# Patient Record
Sex: Female | Born: 1990 | Race: Black or African American | Hispanic: No | Marital: Single | Smoking: Never smoker
Health system: Southern US, Community
[De-identification: ages and names within clinical notes are randomized; demographics above are authoritative.]

## PROBLEM LIST (undated history)

## (undated) DIAGNOSIS — T7840XA Allergy, unspecified, initial encounter: Secondary | ICD-10-CM

## (undated) HISTORY — DX: Allergy, unspecified, initial encounter: T78.40XA

---

## 2012-03-26 ENCOUNTER — Encounter (HOSPITAL_COMMUNITY): Payer: Self-pay | Admitting: Family Medicine

## 2012-03-26 ENCOUNTER — Emergency Department (HOSPITAL_COMMUNITY)
Admission: EM | Admit: 2012-03-26 | Discharge: 2012-03-26 | Disposition: A | Discharge status: Home / Self Care | Payer: BC Managed Care – PPO | Attending: Emergency Medicine | Admitting: Emergency Medicine

## 2012-03-26 ENCOUNTER — Emergency Department (HOSPITAL_COMMUNITY): Payer: BC Managed Care – PPO

## 2012-03-26 DIAGNOSIS — S60211A Contusion of right wrist, initial encounter: Secondary | ICD-10-CM

## 2012-03-26 DIAGNOSIS — W2209XA Striking against other stationary object, initial encounter: Secondary | ICD-10-CM | POA: Insufficient documentation

## 2012-03-26 DIAGNOSIS — Y9389 Activity, other specified: Secondary | ICD-10-CM | POA: Insufficient documentation

## 2012-03-26 DIAGNOSIS — Y929 Unspecified place or not applicable: Secondary | ICD-10-CM | POA: Insufficient documentation

## 2012-03-26 DIAGNOSIS — S60219A Contusion of unspecified wrist, initial encounter: Secondary | ICD-10-CM | POA: Insufficient documentation

## 2012-03-26 MED ORDER — IBUPROFEN 600 MG PO TABS: 600.0000 mg | ORAL_TABLET | Freq: Four times a day (QID) | ORAL | Status: DC | PRN

## 2012-03-26 NOTE — ED Notes (Signed)
Per pt injured wrist about 1 week ago. sts she heard a pop. Wrist is swollen

## 2012-03-26 NOTE — ED Provider Notes (Signed)
History   This chart was scribed for Renee Skene, MD by Melba Coon, ED Scribe. The patient was seen in room TR07C/TR07C and the patient's care was started at 5:58PM.    CSN: [id number]  Arrival date & time 03/26/12  1654   None     Chief Complaint  Patient presents with  . Wrist Pain    (Consider location/radiation/quality/duration/timing/severity/associated sxs/prior treatment) The history is provided by the patient. No language interpreter was used.   Renee Thornton is a 21 y.o. female who presents to the Emergency Department complaining of constant, moderate to severe right wrist pain and swelling with an onset 3 days ago. She reports a car door hit her arm; after that she sat the wrong way on her wrist and she heard a "pop". She rates the severity of the pain 5/10. Ibuprofen and good strength slightly alleviated the pain but she reports that the pain came back. Denies HA, fever, neck pain, sore throat, rash, back pain, CP, SOB, abdominal pain, nausea, emesis, diarrhea, dysuria, or extremity weakness, numbness, or tingling. No known allergies. No other pertinent medical symptoms.   History reviewed. No pertinent past medical history.  History reviewed. No pertinent past surgical history.  History reviewed. No pertinent family history.  History  Substance Use Topics  . Smoking status: Never Smoker   . Smokeless tobacco: Not on file  . Alcohol Use: No    OB History    Grav Para Term Preterm Abortions TAB SAB Ect Mult Living                  Review of Systems 10 Systems reviewed and are negative for acute change except as noted in the HPI.  Allergies  Review of patient's allergies indicates no known allergies.  Home Medications   Current Outpatient Rx  Name  Route  Sig  Dispense  Refill  . OVER THE COUNTER MEDICATION   Oral   Take 1 tablet by mouth daily as needed. Pain medication           BP 124/75  Pulse 106  Temp 98.2 F (36.8 C)  Resp 18   SpO2 100%  LMP 02/25/2012  Physical Exam  Nursing notes reviewed.  Electronic medical record reviewed. VITAL SIGNS:   Filed Vitals:   03/26/12 1700  BP: 124/75  Pulse: 106  Temp: 98.2 F (36.8 C)  Resp: 18  SpO2: 100%   CONSTITUTIONAL: Awake, oriented, appears non-toxic HENT: Atraumatic, normocephalic, oral mucosa pink and moist, airway patent. Nares patent without drainage. External ears normal. EYES: Conjunctiva clear, EOMI, PERRLA NECK: Trachea midline, non-tender, supple CARDIOVASCULAR: Normal heart rate, Normal rhythm, No murmurs, rubs, gallops PULMONARY/CHEST: Clear to auscultation, no rhonchi, wheezes, or rales. Symmetrical breath sounds. Non-tender. ABDOMINAL: Non-distended, soft, non-tender - no rebound or guarding.  BS normal. NEUROLOGIC: Non-focal, moving all four extremities, no gross sensory or motor deficits. EXTREMITIES: No clubbing, cyanosis, or edema; mild tenderness to palpation on the radial aspect of wrist, no bruising or deformity, neurovascularly intact distally. SKIN: Warm, Dry, No erythema, No rash  ED Course  Procedures (including critical care time)  COORDINATION OF CARE:  6:02PM - imaging reviewed and is unremarkable. Ibuprofen and warm compresses at home is advised for [facility] and she is ready for d/c.  Labs Reviewed - No data to display Dg Wrist Complete Right  03/26/2012  *RADIOLOGY REPORT*  Clinical Data: Wrist pain  RIGHT WRIST - COMPLETE 3+ VIEW  Comparison: None.  Findings: Four views of  the right wrist submitted.  No acute fracture or subluxation.  No radiopaque foreign body.  IMPRESSION: No acute fracture or subluxation.   Original Report Authenticated By: Natasha Mead, M.D.    1. Contusion of right wrist       MDM  Renee Thornton is a 21 y.o. female patient presents with mild contusion to right wrist. No evidence for fracture or severe injury on physical exam on x-ray. Patient discharged home with ibuprofen conservative pain  measures.  I explained the diagnosis and have given explicit precautions to return to the ER including any other new or worsening symptoms. The patient understands and accepts the medical plan as it's been dictated and I have answered their questions. Discharge instructions concerning home care and prescriptions have been given.  The patient is STABLE and is discharged to home in good condition.   I personally performed the services described in this documentation, which was scribed in my presence. The recorded information has been reviewed and is accurate. Renee Thornton, M.D.   Renee Skene, MD 03/26/12 2159

## 2013-09-23 ENCOUNTER — Ambulatory Visit (INDEPENDENT_AMBULATORY_CARE_PROVIDER_SITE_OTHER): Payer: BC Managed Care – PPO | Admitting: Physician Assistant

## 2013-09-23 VITALS — BP 100/62 | HR 68 | Temp 98.8°F | Resp 20 | Ht 63.0 in | Wt 131.2 lb

## 2013-09-23 DIAGNOSIS — R05 Cough: Secondary | ICD-10-CM

## 2013-09-23 DIAGNOSIS — J029 Acute pharyngitis, unspecified: Secondary | ICD-10-CM

## 2013-09-23 DIAGNOSIS — J309 Allergic rhinitis, unspecified: Secondary | ICD-10-CM

## 2013-09-23 DIAGNOSIS — R059 Cough, unspecified: Secondary | ICD-10-CM

## 2013-09-23 MED ORDER — PREDNISONE 20 MG PO TABS: ORAL_TABLET | ORAL | Status: DC

## 2013-09-23 MED ORDER — ALBUTEROL SULFATE HFA 108 (90 BASE) MCG/ACT IN AERS: 2.0000 | INHALATION_SPRAY | RESPIRATORY_TRACT | Status: DC | PRN

## 2013-09-23 MED ORDER — FLUTICASONE PROPIONATE 50 MCG/ACT NA SUSP: 2.0000 | Freq: Every day | NASAL | Status: DC

## 2013-09-23 NOTE — Patient Instructions (Signed)
-  Get a good quality air filter -Trial of 1 tsp of local honey daily -Take meds as directed -Continue Zyrtec daily -Get a 2nd dehumidifier    Allergic Rhinitis Allergic rhinitis is when the mucous membranes in the nose respond to allergens. Allergens are particles in the air that cause your body to have an allergic reaction. This causes you to release allergic antibodies. Through a chain of events, these eventually cause you to release histamine into the blood stream. Although meant to protect the body, it is this release of histamine that causes your discomfort, such as frequent sneezing, congestion, and an itchy, runny nose.  CAUSES  Seasonal allergic rhinitis (hay fever) is caused by pollen allergens that may come from grasses, trees, and weeds. Year-round allergic rhinitis (perennial allergic rhinitis) is caused by allergens such as house dust mites, pet dander, and mold spores.  SYMPTOMS   Nasal stuffiness (congestion).  Itchy, runny nose with sneezing and tearing of the eyes. DIAGNOSIS  Your health care provider can help you determine the allergen or allergens that trigger your symptoms. If you and your health care provider are unable to determine the allergen, skin or blood testing may be used. TREATMENT  Allergic Rhinitis does not have a cure, but it can be controlled by:  Medicines and allergy shots (immunotherapy).  Avoiding the allergen. Hay fever may often be treated with antihistamines in pill or nasal spray forms. Antihistamines block the effects of histamine. There are over-the-counter medicines that may help with nasal congestion and swelling around the eyes. Check with your health care provider before taking or giving this medicine.  If avoiding the allergen or the medicine prescribed do not work, there are many new medicines your health care provider can prescribe. Stronger medicine may be used if initial measures are ineffective. Desensitizing injections can be used if  medicine and avoidance does not work. Desensitization is when a patient is given ongoing shots until the body becomes less sensitive to the allergen. Make sure you follow up with your health care provider if problems continue. HOME CARE INSTRUCTIONS It is not possible to completely avoid allergens, but you can reduce your symptoms by taking steps to limit your exposure to them. It helps to know exactly what you are allergic to so that you can avoid your specific triggers. SEEK MEDICAL CARE IF:   You have a fever.  You develop a cough that does not stop easily (persistent).  You have shortness of breath.  You start wheezing.  Symptoms interfere with normal daily activities. Document Released: 12/29/2000 Document Revised: 01/24/2013 Document Reviewed: 12/11/2012 [facility] Patient Information 2014 [place], [place].

## 2013-09-23 NOTE — Progress Notes (Signed)
Subjective:    Patient ID: Renee Thornton, female    DOB: 01-23-1991, 23 y.o.   MRN: [id number]  HPI Primary Physician: Default, Provider, MD  Chief Complaint: 2 day history of URI  HPI: 23 y.o. female with history below presents with 2 day history of cough and sore throat. Long standing on and off nasal congestion, rhinorrhea, and sneezing. Afebrile. No chills. Cough is mildly productive of light brown sputum and worse in the morning. No SOB or wheezing. Symptoms are worse when she is in her apartment although she does note them when she goes outside as well.     Has been trying Zyrtec in the past off and on. Has not seen too much of an improvement. Currently living in a stuffy apartment. Has to leave the window open. Has one dehumidifier that is located in her bedroom. No pets. Carpet floors. Cleans regularly. Renee Thornton is up in July. Trying to get out sooner.    Past Medical History  Diagnosis Date  . Allergy      Home Meds: Prior to Admission medications   Medication Sig Start Date End Date Taking? Authorizing Provider  cetirizine (ZYRTEC) 10 MG tablet Take 10 mg by mouth daily as needed for allergies.   Yes Historical Provider, MD    Allergies: No Known Allergies  History   Social History  . Marital Status: Single    Spouse Name: N/A    Number of Children: N/A  . Years of Education: N/A   Occupational History  . Not on file.   Social History Main Topics  . Smoking status: Never Smoker   . Smokeless tobacco: Never Used  . Alcohol Use: No  . Drug Use: No  . Sexual Activity: Not on file   Other Topics Concern  . Not on file   Social History Narrative  . No narrative on file     Review of Systems  Constitutional: Positive for fatigue. Negative for fever and chills.  HENT: Positive for congestion, rhinorrhea, sneezing and sore throat. Negative for ear pain, hearing loss and postnasal drip.        Nasal congestion.   Eyes: Negative for discharge and itching.    Associated when she is in her apartment.   Respiratory: Positive for cough. Negative for shortness of breath and wheezing.        Cough is mildly productive of light brown sputum. Cough is worse in the morning.   Gastrointestinal: Negative for nausea, vomiting and diarrhea.  Musculoskeletal: Negative for myalgias.  Allergic/Immunologic: Positive for environmental allergies.       Seasonal allergies.   Neurological: Negative for headaches.       Objective:   Physical Exam  Physical Exam: Blood pressure 100/62, pulse 68, temperature 98.8 F (37.1 C), temperature source Oral, resp. rate 20, height 5\' 3"  (1.6 m), weight 131 lb 4 oz (59.535 kg), last menstrual period 09/14/2013, SpO2 100.00%., Body mass index is 23.26 kg/(m^2). General: Well developed, well nourished, in no acute distress. Head: Normocephalic, atraumatic, eyes without discharge, sclera non-icteric, nares are pale and boggy. Bilateral auditory canals clear, TM's are without perforation, pearly grey and translucent with reflective cone of light bilaterally. Oral cavity moist, posterior pharynx without exudate, erythema, peritonsillar abscess, or post nasal drip. Uvula midline.   Neck: Supple. No thyromegaly. Full ROM. No lymphadenopathy. No nuchal rigidity.  Lungs: Clear bilaterally to auscultation without wheezes, rales, or rhonchi. Breathing is unlabored. Heart: RRR with S1 S2. No murmurs, rubs, or gallops  appreciated. Msk:  Strength and tone normal for age. Extremities/Skin: Warm and dry. No clubbing or cyanosis. No edema. No rashes or suspicious lesions. Neuro: Alert and oriented X 3. Moves all extremities spontaneously. Gait is normal. CNII-XII grossly in tact. Psych:  Responds to questions appropriately with a normal affect.        Assessment & Plan:  23 year old female with allergic rhinitis -Prednisone 20 mg #18 3x3, 2x3, 1x3 no RF -Flonase 2 sprays each nare daily #1 RF 6 -Proventil 2 puffs inhaled q 4-6 hours  prn #1 RF 2 -Continue Zyrtec -Use good quality air filters -1 tsp of local honey daily -RTC prn   Renee Thornton, MHS, PA-C [facility] [address] [place], [place] [address] [phone] [facility] 09/23/2013 3:10 PM

## 2014-01-06 ENCOUNTER — Ambulatory Visit (INDEPENDENT_AMBULATORY_CARE_PROVIDER_SITE_OTHER): Payer: BC Managed Care – PPO | Admitting: Family Medicine

## 2014-01-06 VITALS — BP 102/68 | HR 78 | Temp 98.1°F | Resp 18 | Ht 63.0 in | Wt 140.6 lb

## 2014-01-06 DIAGNOSIS — R350 Frequency of micturition: Secondary | ICD-10-CM

## 2014-01-06 DIAGNOSIS — N390 Urinary tract infection, site not specified: Secondary | ICD-10-CM

## 2014-01-06 LAB — POCT URINALYSIS DIPSTICK
BILIRUBIN UA: NEGATIVE
Glucose, UA: NEGATIVE
Ketones, UA: NEGATIVE
NITRITE UA: POSITIVE
PH UA: 6
Spec Grav, UA: 1.02
Urobilinogen, UA: 0.2

## 2014-01-06 LAB — POCT UA - MICROSCOPIC ONLY
CASTS, UR, LPF, POC: NEGATIVE
CRYSTALS, UR, HPF, POC: NEGATIVE
Mucus, UA: NEGATIVE
YEAST UA: NEGATIVE

## 2014-01-06 MED ORDER — CIPROFLOXACIN HCL 500 MG PO TABS: 500.0000 mg | ORAL_TABLET | Freq: Two times a day (BID) | ORAL | Status: DC

## 2014-01-06 NOTE — Patient Instructions (Signed)

## 2014-01-06 NOTE — Progress Notes (Signed)
Patient ID: Keeara Frees MRN: [id number], DOB: [birth year], 23 y.o. Date of Encounter: 01/06/2014, 11:40 AM  Primary Physician: Default, Provider, MD  Chief Complaint:  Chief Complaint  Patient presents with  . Abdominal Pain    x 3 days--left upper abdominal pain--painful to urinate    . Urinary Frequency    no itching--no burning--no odor    HPI: 23 y.o. year old female presents with 3 day history of dysuria, urgency, and frequency. Last UTI was years ago No hematuria LMP: 12/13/13 No sick contacts, recent antibiotics, or recent travels. She is not on any BC pills. She was taking [facility] cranberry pills to try and help with her symptoms but she said it provided no relief.   Studying speech/language path at [employer].  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  No vaginal discharge, back pain, fever, or foul smelling urine.  Past Medical History  Diagnosis Date  . Allergy      Home Meds: Prior to Admission medications   Not on File    Allergies: No Known Allergies  History   Social History  . Marital Status: Single    Spouse Name: N/A    Number of Children: N/A  . Years of Education: N/A   Occupational History  . Not on file.   Social History Main Topics  . Smoking status: Never Smoker   . Smokeless tobacco: Never Used  . Alcohol Use: No  . Drug Use: No  . Sexual Activity: Not on file   Other Topics Concern  . Not on file   Social History Narrative  . No narrative on file     Review of Systems: Constitutional: negative for chills, fever, night sweats or weight changes Cardiovascular: negative for chest pain or palpitations Respiratory: negative for hemoptysis, wheezing, or shortness of breath Abdominal: negative for nausea, vomiting or diarrhea. Positive for abdominal pain Dermatological: negative for rash Neurologic: negative for headache   Physical Exam: Blood pressure 102/68, pulse 78, temperature 98.1 F (36.7 C), temperature source Oral, resp. rate 18, height 5'  3" (1.6 m), weight 140 lb 9.6 oz (63.776 kg), SpO2 100.00%., Body mass index is 24.91 kg/(m^2). General: Well developed, well nourished, in no acute distress. Head: Normocephalic, atraumatic, eyes without discharge, sclera non-icteric, nares are congested. Bilateral auditory canals clear, TM's are without perforation, pearly grey with reflective cone of light bilaterally. Serous effusion bilaterally behind TM's. Maxillary sinus TTP. Oral cavity moist, dentition normal. Posterior pharynx with post nasal drip and mild erythema. No peritonsillar abscess or tonsillar exudate. Neck: Supple. No thyromegaly. Full ROM. No lymphadenopathy. Lungs: Coarse breath sounds bilaterally without Clear bilaterally to auscultation without wheezes, rales, or rhonchi. Breathing is unlabored.  Heart: RRR with S1 S2. No murmurs, rubs, or gallops appreciated. Abdomen: Soft, non-tender, non-distended with normoactive bowel sounds. No hepatosplenomegaly. No rebound/guarding. No obvious abdominal masses. McBurney's, Rovsing's, Iliopsoas, and table jar all negative. Msk:  Strength and tone normal for age. Extremities: No clubbing or cyanosis. No edema. Neuro: Alert and oriented X 3. Moves all extremities spontaneously. CNII-XII grossly in tact. Psych:  Responds to questions appropriately with a normal affect.   Labs: Results for orders placed in visit on 01/06/14  POCT URINALYSIS DIPSTICK      Result Value Ref Range   Color, UA yellow     Clarity, UA hazy     Glucose, UA neg     Bilirubin, UA neg     Ketones, UA neg     Spec Grav, UA 1.020  Blood, UA moderate     pH, UA 6.0     Protein, UA trace     Urobilinogen, UA 0.2     Nitrite, UA positive     Leukocytes, UA large (3+)    POCT UA - MICROSCOPIC ONLY      Result Value Ref Range   WBC, Ur, HPF, POC tntc     RBC, urine, microscopic 5-10     Bacteria, U Microscopic 1+     Mucus, UA neg     Epithelial cells, urine per micros 0-4     Crystals, Ur, HPF, POC  neg     Casts, Ur, LPF, POC neg     Yeast, UA neg       ASSESSMENT AND PLAN:  23 y.o. year old female with  1. Urine frequency    Urine frequency - Plan: POCT urinalysis dipstick, POCT UA - Microscopic Only, ciprofloxacin (CIPRO) 500 MG tablet, Urine culture  Urinary tract infection, site not specified - Plan: ciprofloxacin (CIPRO) 500 MG tablet, Urine culture    Signed, Elvina Sidle, MD 01/06/2014 11:40 AM

## 2014-01-08 LAB — URINE CULTURE: Colony Count: >=100000

## 2014-01-12 IMAGING — CR DG WRIST COMPLETE 3+V*R*
4 series · 4 of 4 positions shown · non-contrast
Comparison: None.

CLINICAL DATA: Wrist pain

RIGHT WRIST - COMPLETE 3+ VIEW

[x wrist pa right]
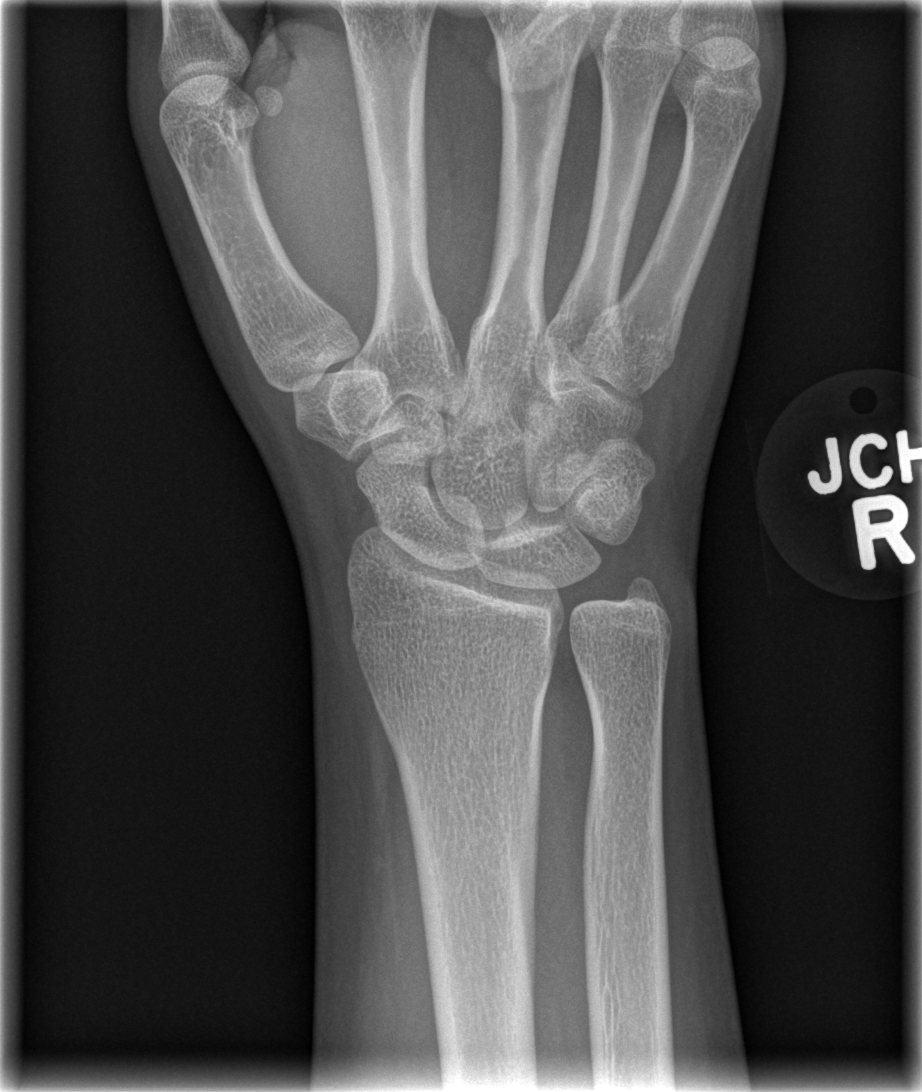

[x wrist obl right]
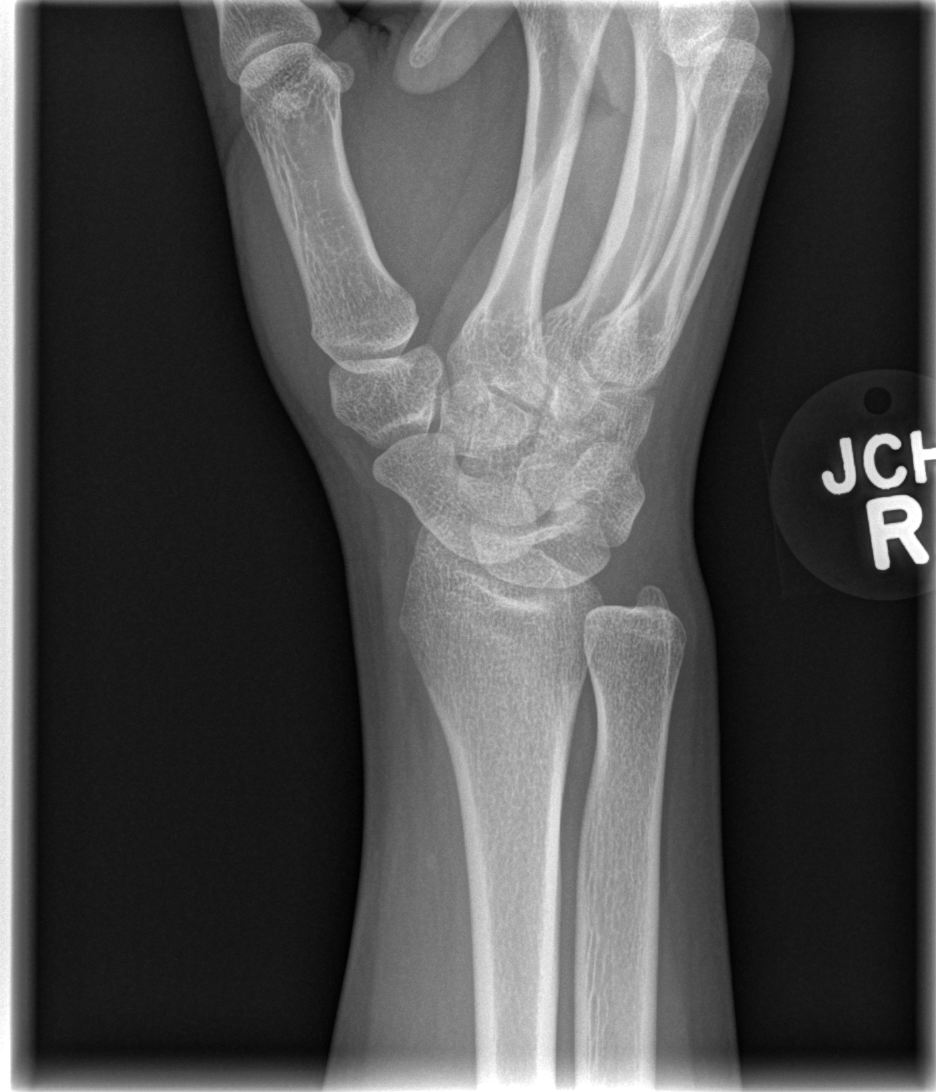

[x wrist lat right]
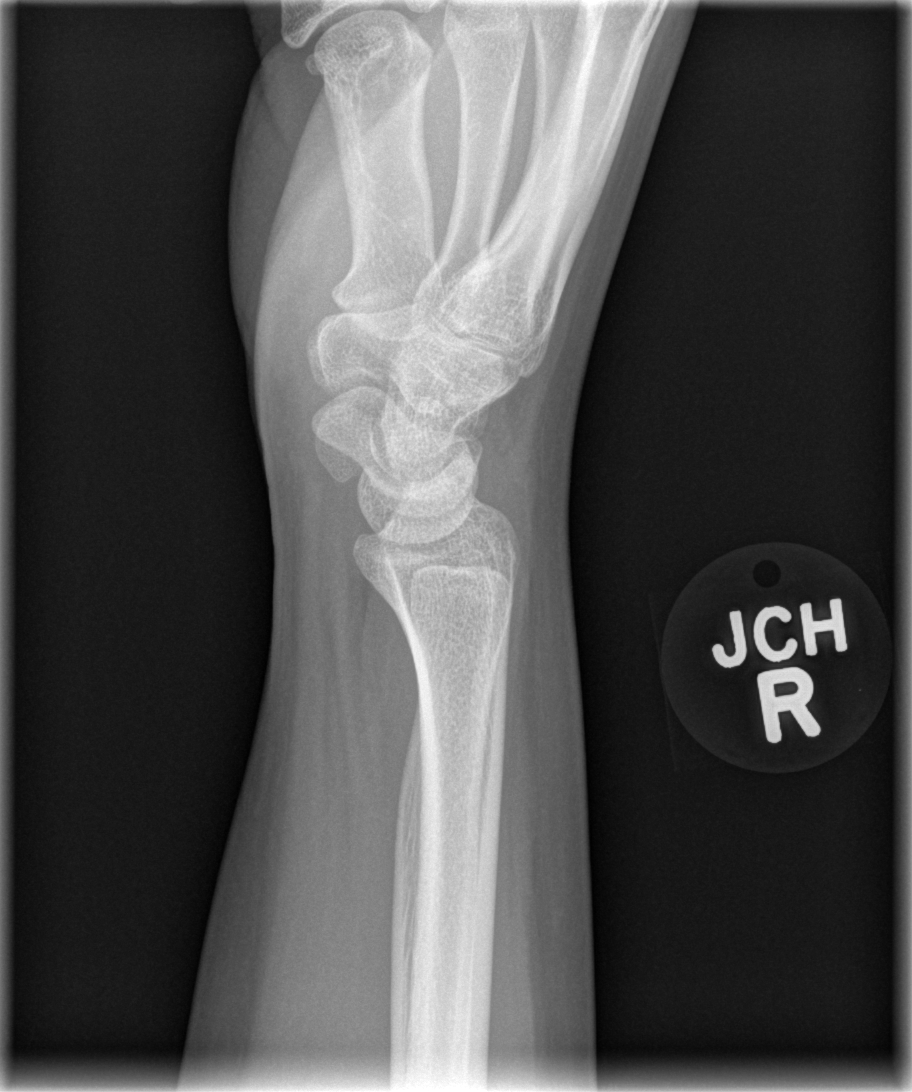

[x navicular]
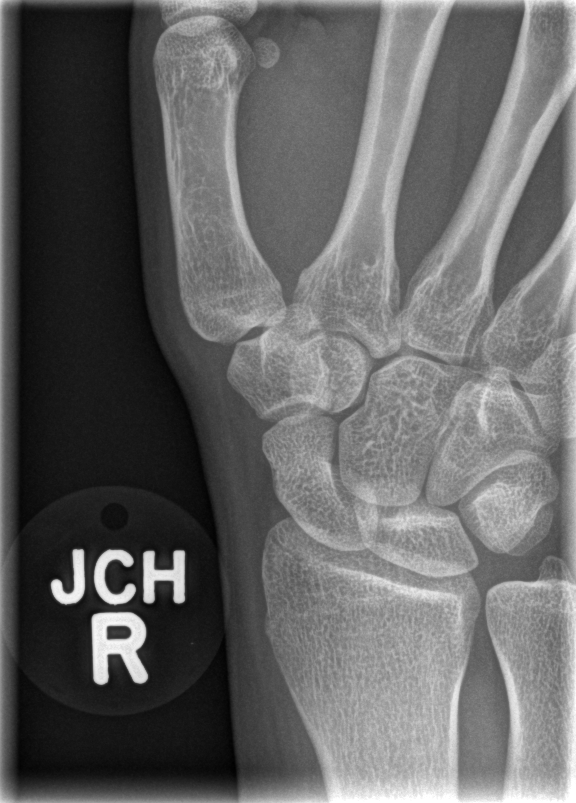

[4 of 4 positions shown; findings below may reference images not displayed]

FINDINGS: Four views of the right wrist submitted.  No acute
fracture or subluxation.  No radiopaque foreign body.
IMPRESSION: No acute fracture or subluxation.

## 2014-02-22 ENCOUNTER — Ambulatory Visit (INDEPENDENT_AMBULATORY_CARE_PROVIDER_SITE_OTHER): Payer: BC Managed Care – PPO | Admitting: Family Medicine

## 2014-02-22 VITALS — BP 126/74 | HR 78 | Temp 98.7°F | Resp 16 | Ht 63.0 in | Wt 141.0 lb

## 2014-02-22 DIAGNOSIS — A749 Chlamydial infection, unspecified: Secondary | ICD-10-CM

## 2014-02-22 DIAGNOSIS — N946 Dysmenorrhea, unspecified: Secondary | ICD-10-CM

## 2014-02-22 LAB — POCT URINE PREGNANCY: Preg Test, Ur: NEGATIVE

## 2014-02-22 NOTE — Progress Notes (Addendum)
Subjective:    Patient ID: Renee Rodeshelsea Thornton, female    DOB: 05/03/1990, 23 y.o.   MRN: [id number]   Chief Complaint  Patient presents with  . Abdominal Cramping    x 1 today needs note    There are no active problems to display for this patient.  Past Medical History  Diagnosis Date  . Allergy    No past surgical history on file. No Known Allergies Prior to Admission medications   Not on File   History   Social History  . Marital Status: Single    Spouse Name: N/A    Number of Children: N/A  . Years of Education: N/A   Occupational History  . Not on file.   Social History Main Topics  . Smoking status: Never Smoker   . Smokeless tobacco: Never Used  . Alcohol Use: No  . Drug Use: No  . Sexual Activity: Not on file   Other Topics Concern  . Not on file   Social History Narrative   HPI  Renee RodesChelsea Kalas is a 23 y.o. female  PCP: Default, Provider, MD   HPI Comments  Patient reports uterine cramping; she began her menstrual cycle today. She reports that her cramps are worse than normal. She was seen two weeks PTA and tested positive for chlamydia (treated with an antibiotic, with which she was compliant) and was supposed to return for a check up. She denies fevers or any unusual discharge. She denies any urinary symptoms (dysuria).   Her LNMP before this one was 1 month PTA; she believes this period may be earlier than usual.  She is currently sexually active. Last sexual encounter 1 month PTA; unprotected.  Number of lifetime sexual partners: 729, last partner was a new partner.   Review of Systems  Gastrointestinal: Positive for abdominal pain.  Genitourinary: Negative for vaginal discharge.  All other systems reviewed and are negative.      Objective:   Physical Exam  Constitutional: She is oriented to person, place, and time. She appears well-developed and well-nourished.  HENT:  Head: Normocephalic and atraumatic.  Neck: No tracheal deviation  present.  Cardiovascular: Normal rate.   Pulmonary/Chest: Effort normal.  Abdominal: There is no rebound and no guarding.  Minimal tenderness over suprapubic/lower abdomen; no lateral tenderness  Neurological: She is alert and oriented to person, place, and time.  Skin: Skin is warm and dry.  Psychiatric: She has a normal mood and affect. Her behavior is normal.  Nursing note and vitals reviewed.   Filed Vitals:   02/22/14 1459  BP: 126/74  Pulse: 78  Temp: 98.7 F (37.1 C)  Resp: 16  Height: 5\' 3"  (1.6 m)  Weight: 141 lb (63.957 kg)  SpO2: 99%   Results for orders placed or performed in visit on 02/22/14  POCT urine pregnancy  Result Value Ref Range   Preg Test, Ur Negative        Assessment & Plan:  Renee RodesChelsea Iten is a 23 y.o. female Menstrual cramps - Plan: POCT urine pregnancy, GC/Chlamydia Probe Amp  -sx care discussed, with NSAID, rtc precautions.   -out of work note given.  Chlamydia infection - Plan: GC/Chlamydia Probe Amp   -s/p treatment,  Will check test of cure.  Safer sex practices discussed and contraceptive options discussed - handout given.    No orders of the defined types were placed in this encounter.   Patient Instructions  Your pregnancy test was negative today. You should receive a  call or letter about your other lab results within the next week to 10 days. Over the counter Alleve can help with the cramping - can start this few days before next menses.   Condoms every time with sexual intercourse, and see the information on other contraceptive options. If you would like to start these, return to discuss further.   Return to the clinic or go to the nearest emergency room if any of your symptoms worsen or new symptoms occur.  Dysmenorrhea Menstrual cramps (dysmenorrhea) are caused by the muscles of the uterus tightening (contracting) during a menstrual period. For some women, this discomfort is merely bothersome. For others, dysmenorrhea can be  severe enough to interfere with everyday activities for a few days each month. Primary dysmenorrhea is menstrual cramps that last a couple of days when you start having menstrual periods or soon after. This often begins after a teenager starts having her period. As a woman gets older or has a baby, the cramps will usually lessen or disappear. Secondary dysmenorrhea begins later in life, lasts longer, and the pain may be stronger than primary dysmenorrhea. The pain may start before the period and last a few days after the period.  CAUSES  Dysmenorrhea is usually caused by an underlying problem, such as:  The tissue lining the uterus grows outside of the uterus in other areas of the body (endometriosis).  The endometrial tissue, which normally lines the uterus, is found in or grows into the muscular walls of the uterus (adenomyosis).  The pelvic blood vessels are engorged with blood just before the menstrual period (pelvic congestive syndrome).  Overgrowth of cells (polyps) in the lining of the uterus or cervix.  Falling down of the uterus (prolapse) because of loose or stretched ligaments.  Depression.  Bladder problems, infection, or inflammation.  Problems with the intestine, a tumor, or irritable bowel syndrome.  Cancer of the female organs or bladder.  A severely tipped uterus.  A very tight opening or closed cervix.  Noncancerous tumors of the uterus (fibroids).  Pelvic inflammatory disease (PID).  Pelvic scarring (adhesions) from a previous surgery.  Ovarian cyst.  An intrauterine device (IUD) used for birth control. RISK FACTORS You may be at greater risk of dysmenorrhea if:  You are younger than age 73.  You started puberty early.  You have irregular or heavy bleeding.  You have never given birth.  You have a family history of this problem.  You are a smoker. SIGNS AND SYMPTOMS   Cramping or throbbing pain in your lower abdomen.  Headaches.  Lower back  pain.  Nausea or vomiting.  Diarrhea.  Sweating or dizziness.  Loose stools. DIAGNOSIS  A diagnosis is based on your history, symptoms, physical exam, diagnostic tests, or procedures. Diagnostic tests or procedures may include:  Blood tests.  Ultrasonography.  An examination of the lining of the uterus (dilation and curettage, D&C).  An examination inside your abdomen or pelvis with a scope (laparoscopy).  X-rays.  CT scan.  MRI.  An examination inside the bladder with a scope (cystoscopy).  An examination inside the intestine or stomach with a scope (colonoscopy, gastroscopy). TREATMENT  Treatment depends on the cause of the dysmenorrhea. Treatment may include:  Pain medicine prescribed by your health care provider.  Birth control pills or an IUD with progesterone hormone in it.  Hormone replacement therapy.  Nonsteroidal anti-inflammatory drugs (NSAIDs). These may help stop the production of prostaglandins.  Surgery to remove adhesions, endometriosis, ovarian cyst, or  fibroids.  Removal of the uterus (hysterectomy).  Progesterone shots to stop the menstrual period.  Cutting the nerves on the sacrum that go to the female organs (presacral neurectomy).  Electric current to the sacral nerves (sacral nerve stimulation).  Antidepressant medicine.  Psychiatric therapy, counseling, or group therapy.  Exercise and physical therapy.  Meditation and yoga therapy.  Acupuncture. HOME CARE INSTRUCTIONS   Only take over-the-counter or prescription medicines as directed by your health care provider.  Place a heating pad or hot water bottle on your lower back or abdomen. Do not sleep with the heating pad.  Use aerobic exercises, walking, swimming, biking, and other exercises to help lessen the cramping.  Massage to the lower back or abdomen may help.  Stop smoking.  Avoid alcohol and caffeine. SEEK MEDICAL CARE IF:   Your pain does not get better with  medicine.  You have pain with sexual intercourse.  Your pain increases and is not controlled with medicines.  You have abnormal vaginal bleeding with your period.  You develop nausea or vomiting with your period that is not controlled with medicine. SEEK IMMEDIATE MEDICAL CARE IF:  You pass out.  Document Released: 04/05/2005 Document Revised: 12/06/2012 Document Reviewed: 09/21/2012 [facility] Patient Information 2015 [place], [place]. This information is not intended to replace advice given to you by your health care provider. Make sure you discuss any questions you have with your health care provider.  Contraception Choices Contraception (birth control) is the use of any methods or devices to prevent pregnancy. Below are some methods to help avoid pregnancy. HORMONAL METHODS   Contraceptive implant. This is a thin, plastic tube containing progesterone hormone. It does not contain estrogen hormone. Your health care provider inserts the tube in the inner part of the upper arm. The tube can remain in place for up to 3 years. After 3 years, the implant must be removed. The implant prevents the ovaries from releasing an egg (ovulation), thickens the cervical mucus to prevent sperm from entering the uterus, and thins the lining of the inside of the uterus.  Progesterone-only injections. These injections are given every 3 months by your health care provider to prevent pregnancy. This synthetic progesterone hormone stops the ovaries from releasing eggs. It also thickens cervical mucus and changes the uterine lining. This makes it harder for sperm to survive in the uterus.  Birth control pills. These pills contain estrogen and progesterone hormone. They work by preventing the ovaries from releasing eggs (ovulation). They also cause the cervical mucus to thicken, preventing the sperm from entering the uterus. Birth control pills are prescribed by a health care provider.Birth control pills can also be  used to treat heavy periods.  Minipill. This type of birth control pill contains only the progesterone hormone. They are taken every day of each month and must be prescribed by your health care provider.  Birth control patch. The patch contains hormones similar to those in birth control pills. It must be changed once a week and is prescribed by a health care provider.  Vaginal ring. The ring contains hormones similar to those in birth control pills. It is left in the vagina for 3 weeks, removed for 1 week, and then a new one is put back in place. The patient must be comfortable inserting and removing the ring from the vagina.A health care provider's prescription is necessary.  Emergency contraception. Emergency contraceptives prevent pregnancy after unprotected sexual intercourse. This pill can be taken right after sex or up to  5 days after unprotected sex. It is most effective the sooner you take the pills after having sexual intercourse. Most emergency contraceptive pills are available without a prescription. Check with your pharmacist. Do not use emergency contraception as your only form of birth control. BARRIER METHODS   Female condom. This is a thin sheath (latex or rubber) that is worn over the penis during sexual intercourse. It can be used with spermicide to increase effectiveness.  Female condom. This is a soft, loose-fitting sheath that is put into the vagina before sexual intercourse.  Diaphragm. This is a soft, latex, dome-shaped barrier that must be fitted by a health care provider. It is inserted into the vagina, along with a spermicidal jelly. It is inserted before intercourse. The diaphragm should be left in the vagina for 6 to 8 hours after intercourse.  Cervical cap. This is a round, soft, latex or plastic cup that fits over the cervix and must be fitted by a health care provider. The cap can be left in place for up to 48 hours after intercourse.  Sponge. This is a soft, circular  piece of polyurethane foam. The sponge has spermicide in it. It is inserted into the vagina after wetting it and before sexual intercourse.  Spermicides. These are chemicals that kill or block sperm from entering the cervix and uterus. They come in the form of creams, jellies, suppositories, foam, or tablets. They do not require a prescription. They are inserted into the vagina with an applicator before having sexual intercourse. The process must be repeated every time you have sexual intercourse. INTRAUTERINE CONTRACEPTION  Intrauterine device (IUD). This is a T-shaped device that is put in a woman's uterus during a menstrual period to prevent pregnancy. There are 2 types:  Copper IUD. This type of IUD is wrapped in copper wire and is placed inside the uterus. Copper makes the uterus and fallopian tubes produce a fluid that kills sperm. It can stay in place for 10 years.  Hormone IUD. This type of IUD contains the hormone progestin (synthetic progesterone). The hormone thickens the cervical mucus and prevents sperm from entering the uterus, and it also thins the uterine lining to prevent implantation of a fertilized egg. The hormone can weaken or kill the sperm that get into the uterus. It can stay in place for 3-5 years, depending on which type of IUD is used. PERMANENT METHODS OF CONTRACEPTION  Female tubal ligation. This is when the woman's fallopian tubes are surgically sealed, tied, or blocked to prevent the egg from traveling to the uterus.  Hysteroscopic sterilization. This involves placing a small coil or insert into each fallopian tube. Your doctor uses a technique called hysteroscopy to do the procedure. The device causes scar tissue to form. This results in permanent blockage of the fallopian tubes, so the sperm cannot fertilize the egg. It takes about 3 months after the procedure for the tubes to become blocked. You must use another form of birth control for these 3 months.  Female  sterilization. This is when the female has the tubes that carry sperm tied off (vasectomy).This blocks sperm from entering the vagina during sexual intercourse. After the procedure, the man can still ejaculate fluid (semen). NATURAL PLANNING METHODS  Natural family planning. This is not having sexual intercourse or using a barrier method (condom, diaphragm, cervical cap) on days the woman could become pregnant.  Calendar method. This is keeping track of the length of each menstrual cycle and identifying when you are fertile.  Ovulation method. This is avoiding sexual intercourse during ovulation.  Symptothermal method. This is avoiding sexual intercourse during ovulation, using a thermometer and ovulation symptoms.  Post-ovulation method. This is timing sexual intercourse after you have ovulated. Regardless of which type or method of contraception you choose, it is important that you use condoms to protect against the transmission of sexually transmitted infections (STIs). Talk with your health care provider about which form of contraception is most appropriate for you. Document Released: 04/05/2005 Document Revised: 04/10/2013 Document Reviewed: 09/28/2012 [facility] Patient Information 2015 [place], [place]. This information is not intended to replace advice given to you by your health care provider. Make sure you discuss any questions you have with your health care provider.     I personally performed the services described in this documentation, which was scribed in my presence. The recorded information has been reviewed and considered, and addended by me as needed.

## 2014-02-22 NOTE — Patient Instructions (Addendum)
Your pregnancy test was negative today. You should receive a call or letter about your other lab results within the next week to 10 days. Over the counter Alleve can help with the cramping - can start this few days before next menses.   Condoms every time with sexual intercourse, and see the information on other contraceptive options. If you would like to start these, return to discuss further.   Return to the clinic or go to the nearest emergency room if any of your symptoms worsen or new symptoms occur.  Dysmenorrhea Menstrual cramps (dysmenorrhea) are caused by the muscles of the uterus tightening (contracting) during a menstrual period. For some women, this discomfort is merely bothersome. For others, dysmenorrhea can be severe enough to interfere with everyday activities for a few days each month. Primary dysmenorrhea is menstrual cramps that last a couple of days when you start having menstrual periods or soon after. This often begins after a teenager starts having her period. As a woman gets older or has a baby, the cramps will usually lessen or disappear. Secondary dysmenorrhea begins later in life, lasts longer, and the pain may be stronger than primary dysmenorrhea. The pain may start before the period and last a few days after the period.  CAUSES  Dysmenorrhea is usually caused by an underlying problem, such as:  The tissue lining the uterus grows outside of the uterus in other areas of the body (endometriosis).  The endometrial tissue, which normally lines the uterus, is found in or grows into the muscular walls of the uterus (adenomyosis).  The pelvic blood vessels are engorged with blood just before the menstrual period (pelvic congestive syndrome).  Overgrowth of cells (polyps) in the lining of the uterus or cervix.  Falling down of the uterus (prolapse) because of loose or stretched ligaments.  Depression.  Bladder problems, infection, or inflammation.  Problems with the  intestine, a tumor, or irritable bowel syndrome.  Cancer of the female organs or bladder.  A severely tipped uterus.  A very tight opening or closed cervix.  Noncancerous tumors of the uterus (fibroids).  Pelvic inflammatory disease (PID).  Pelvic scarring (adhesions) from a previous surgery.  Ovarian cyst.  An intrauterine device (IUD) used for birth control. RISK FACTORS You may be at greater risk of dysmenorrhea if:  You are younger than age 77.  You started puberty early.  You have irregular or heavy bleeding.  You have never given birth.  You have a family history of this problem.  You are a smoker. SIGNS AND SYMPTOMS   Cramping or throbbing pain in your lower abdomen.  Headaches.  Lower back pain.  Nausea or vomiting.  Diarrhea.  Sweating or dizziness.  Loose stools. DIAGNOSIS  A diagnosis is based on your history, symptoms, physical exam, diagnostic tests, or procedures. Diagnostic tests or procedures may include:  Blood tests.  Ultrasonography.  An examination of the lining of the uterus (dilation and curettage, D&C).  An examination inside your abdomen or pelvis with a scope (laparoscopy).  X-rays.  CT scan.  MRI.  An examination inside the bladder with a scope (cystoscopy).  An examination inside the intestine or stomach with a scope (colonoscopy, gastroscopy). TREATMENT  Treatment depends on the cause of the dysmenorrhea. Treatment may include:  Pain medicine prescribed by your health care provider.  Birth control pills or an IUD with progesterone hormone in it.  Hormone replacement therapy.  Nonsteroidal anti-inflammatory drugs (NSAIDs). These may help stop the production of prostaglandins.  Surgery to remove adhesions, endometriosis, ovarian cyst, or fibroids.  Removal of the uterus (hysterectomy).  Progesterone shots to stop the menstrual period.  Cutting the nerves on the sacrum that go to the female organs (presacral  neurectomy).  Electric current to the sacral nerves (sacral nerve stimulation).  Antidepressant medicine.  Psychiatric therapy, counseling, or group therapy.  Exercise and physical therapy.  Meditation and yoga therapy.  Acupuncture. HOME CARE INSTRUCTIONS   Only take over-the-counter or prescription medicines as directed by your health care provider.  Place a heating pad or hot water bottle on your lower back or abdomen. Do not sleep with the heating pad.  Use aerobic exercises, walking, swimming, biking, and other exercises to help lessen the cramping.  Massage to the lower back or abdomen may help.  Stop smoking.  Avoid alcohol and caffeine. SEEK MEDICAL CARE IF:   Your pain does not get better with medicine.  You have pain with sexual intercourse.  Your pain increases and is not controlled with medicines.  You have abnormal vaginal bleeding with your period.  You develop nausea or vomiting with your period that is not controlled with medicine. SEEK IMMEDIATE MEDICAL CARE IF:  You pass out.  Document Released: 04/05/2005 Document Revised: 12/06/2012 Document Reviewed: 09/21/2012 [facility] Patient Information 2015 [place], [place]. This information is not intended to replace advice given to you by your health care provider. Make sure you discuss any questions you have with your health care provider.  Contraception Choices Contraception (birth control) is the use of any methods or devices to prevent pregnancy. Below are some methods to help avoid pregnancy. HORMONAL METHODS   Contraceptive implant. This is a thin, plastic tube containing progesterone hormone. It does not contain estrogen hormone. Your health care provider inserts the tube in the inner part of the upper arm. The tube can remain in place for up to 3 years. After 3 years, the implant must be removed. The implant prevents the ovaries from releasing an egg (ovulation), thickens the cervical mucus to prevent  sperm from entering the uterus, and thins the lining of the inside of the uterus.  Progesterone-only injections. These injections are given every 3 months by your health care provider to prevent pregnancy. This synthetic progesterone hormone stops the ovaries from releasing eggs. It also thickens cervical mucus and changes the uterine lining. This makes it harder for sperm to survive in the uterus.  Birth control pills. These pills contain estrogen and progesterone hormone. They work by preventing the ovaries from releasing eggs (ovulation). They also cause the cervical mucus to thicken, preventing the sperm from entering the uterus. Birth control pills are prescribed by a health care provider.Birth control pills can also be used to treat heavy periods.  Minipill. This type of birth control pill contains only the progesterone hormone. They are taken every day of each month and must be prescribed by your health care provider.  Birth control patch. The patch contains hormones similar to those in birth control pills. It must be changed once a week and is prescribed by a health care provider.  Vaginal ring. The ring contains hormones similar to those in birth control pills. It is left in the vagina for 3 weeks, removed for 1 week, and then a new one is put back in place. The patient must be comfortable inserting and removing the ring from the vagina.A health care provider's prescription is necessary.  Emergency contraception. Emergency contraceptives prevent pregnancy after unprotected sexual intercourse. This pill can  be taken right after sex or up to 5 days after unprotected sex. It is most effective the sooner you take the pills after having sexual intercourse. Most emergency contraceptive pills are available without a prescription. Check with your pharmacist. Do not use emergency contraception as your only form of birth control. BARRIER METHODS   Female condom. This is a thin sheath (latex or rubber)  that is worn over the penis during sexual intercourse. It can be used with spermicide to increase effectiveness.  Female condom. This is a soft, loose-fitting sheath that is put into the vagina before sexual intercourse.  Diaphragm. This is a soft, latex, dome-shaped barrier that must be fitted by a health care provider. It is inserted into the vagina, along with a spermicidal jelly. It is inserted before intercourse. The diaphragm should be left in the vagina for 6 to 8 hours after intercourse.  Cervical cap. This is a round, soft, latex or plastic cup that fits over the cervix and must be fitted by a health care provider. The cap can be left in place for up to 48 hours after intercourse.  Sponge. This is a soft, circular piece of polyurethane foam. The sponge has spermicide in it. It is inserted into the vagina after wetting it and before sexual intercourse.  Spermicides. These are chemicals that kill or block sperm from entering the cervix and uterus. They come in the form of creams, jellies, suppositories, foam, or tablets. They do not require a prescription. They are inserted into the vagina with an applicator before having sexual intercourse. The process must be repeated every time you have sexual intercourse. INTRAUTERINE CONTRACEPTION  Intrauterine device (IUD). This is a T-shaped device that is put in a woman's uterus during a menstrual period to prevent pregnancy. There are 2 types:  Copper IUD. This type of IUD is wrapped in copper wire and is placed inside the uterus. Copper makes the uterus and fallopian tubes produce a fluid that kills sperm. It can stay in place for 10 years.  Hormone IUD. This type of IUD contains the hormone progestin (synthetic progesterone). The hormone thickens the cervical mucus and prevents sperm from entering the uterus, and it also thins the uterine lining to prevent implantation of a fertilized egg. The hormone can weaken or kill the sperm that get into the  uterus. It can stay in place for 3-5 years, depending on which type of IUD is used. PERMANENT METHODS OF CONTRACEPTION  Female tubal ligation. This is when the woman's fallopian tubes are surgically sealed, tied, or blocked to prevent the egg from traveling to the uterus.  Hysteroscopic sterilization. This involves placing a small coil or insert into each fallopian tube. Your doctor uses a technique called hysteroscopy to do the procedure. The device causes scar tissue to form. This results in permanent blockage of the fallopian tubes, so the sperm cannot fertilize the egg. It takes about 3 months after the procedure for the tubes to become blocked. You must use another form of birth control for these 3 months.  Female sterilization. This is when the female has the tubes that carry sperm tied off (vasectomy).This blocks sperm from entering the vagina during sexual intercourse. After the procedure, the man can still ejaculate fluid (semen). NATURAL PLANNING METHODS  Natural family planning. This is not having sexual intercourse or using a barrier method (condom, diaphragm, cervical cap) on days the woman could become pregnant.  Calendar method. This is keeping track of the length of each  menstrual cycle and identifying when you are fertile.  Ovulation method. This is avoiding sexual intercourse during ovulation.  Symptothermal method. This is avoiding sexual intercourse during ovulation, using a thermometer and ovulation symptoms.  Post-ovulation method. This is timing sexual intercourse after you have ovulated. Regardless of which type or method of contraception you choose, it is important that you use condoms to protect against the transmission of sexually transmitted infections (STIs). Talk with your health care provider about which form of contraception is most appropriate for you. Document Released: 04/05/2005 Document Revised: 04/10/2013 Document Reviewed: 09/28/2012 [facility] Patient  Information 2015 [place], [place]. This information is not intended to replace advice given to you by your health care provider. Make sure you discuss any questions you have with your health care provider.

## 2014-02-23 LAB — GC/CHLAMYDIA PROBE AMP
CT Probe RNA: NEGATIVE
GC PROBE AMP APTIMA: NEGATIVE

## 2014-03-30 ENCOUNTER — Ambulatory Visit (INDEPENDENT_AMBULATORY_CARE_PROVIDER_SITE_OTHER): Payer: BC Managed Care – PPO | Admitting: Family Medicine

## 2014-03-30 VITALS — BP 112/72 | HR 83 | Temp 98.7°F | Resp 16 | Ht 64.0 in | Wt 140.0 lb

## 2014-03-30 DIAGNOSIS — R110 Nausea: Secondary | ICD-10-CM

## 2014-03-30 DIAGNOSIS — J029 Acute pharyngitis, unspecified: Secondary | ICD-10-CM

## 2014-03-30 LAB — POCT RAPID STREP A (OFFICE): RAPID STREP A SCREEN: NEGATIVE

## 2014-03-30 MED ORDER — ONDANSETRON 4 MG PO TBDP: 4.0000 mg | ORAL_TABLET | Freq: Three times a day (TID) | ORAL | Status: DC | PRN

## 2014-03-30 MED ORDER — ONDANSETRON 4 MG PO TBDP
4.0000 mg | ORAL_TABLET | Freq: Once | ORAL | Status: AC
  Administered 2014-03-30: 4 mg via ORAL

## 2014-03-30 NOTE — Progress Notes (Signed)
[facility] [address], [place] [id number] [phone]  Date:  03/30/2014   Name:  Renee Thornton   DOB:  30-Sep-1990   MRN:  [id number]  PCP:  Default, Provider, MD    Chief Complaint: Sore Throat   History of Present Illness:  Renee Thornton is a 23 y.o. very pleasant female patient who presents with the following:  she is here today with a ST and lesions in her throat for one day- "my throat looks disgusting." .  She has not noted a fever.   The throat is just a bit sore but no so bad.  She has not noted flu like sx such as aches or fatigue.   She has felt nauseated but has not vomited.   She is generally in good health. She has never had this in the past Never a smoker Menses currently She has not tried any medications so far at home  There are no active problems to display for this patient.   Past Medical History  Diagnosis Date  . Allergy     No past surgical history on file.  History  Substance Use Topics  . Smoking status: Never Smoker   . Smokeless tobacco: Never Used  . Alcohol Use: No    No family history on file.  No Known Allergies  Medication list has been reviewed and updated.  No current outpatient prescriptions on file prior to visit.   No current facility-administered medications on file prior to visit.    Review of Systems:  As per HPI- otherwise negative.   Physical Examination: Filed Vitals:   03/30/14 1621  BP: 112/72  Pulse: 83  Temp: 98.7 F (37.1 C)  Resp: 16   Filed Vitals:   03/30/14 1621  Height: 5\' 4"  (1.626 m)  Weight: 140 lb (63.504 kg)   Body mass index is 24.02 kg/(m^2). Ideal Body Weight: Weight in (lb) to have BMI = 25: 145.3  GEN: WDWN, NAD, Non-toxic, A & O x 3, looks well HEENT: Atraumatic, Normocephalic. Neck supple. No masses, No LAD.  Bilateral TM wnl, oropharynx normal.  PEERL,EOMI.   Her tongue and tonsils appear normal  Ears and Nose: No external deformity. CV: RRR, No  M/G/R. No JVD. No thrill. No extra heart sounds. PULM: CTA B, no wheezes, crackles, rhonchi. No retractions. No resp. distress. No accessory muscle use. ABD: S, NT, ND EXTR: No c/c/e NEURO Normal gait.  PSYCH: Normally interactive. Conversant. Not depressed or anxious appearing.  Calm demeanor.   Given zofran 4mg  po once for nausea.  His helped her sx and she felt better  Results for orders placed or performed in visit on 03/30/14  POCT rapid strep A  Result Value Ref Range   Rapid Strep A Screen Negative Negative    Assessment and Plan: Nausea without vomiting - Plan: ondansetron (ZOFRAN-ODT) disintegrating tablet 4 mg, ondansetron (ZOFRAN ODT) 4 MG disintegrating tablet  Acute pharyngitis, unspecified pharyngitis type - Plan: POCT rapid strep A  Likely viral illness.  Reassured that her throat actually appears normal. She will use zofran as needed for nausea and monitor her sx closely, let me know if not feeling better!   Signed Abbe AmsterdamJessica Neshia Mckenzie, MD

## 2014-03-30 NOTE — Patient Instructions (Signed)
Use the zofran as needed for nausea.  Let me know if you do not feel better soon- Sooner if worse.

## 2014-06-25 ENCOUNTER — Ambulatory Visit: Payer: BC Managed Care – PPO

## 2014-07-24 ENCOUNTER — Ambulatory Visit (INDEPENDENT_AMBULATORY_CARE_PROVIDER_SITE_OTHER): Payer: BC Managed Care – PPO | Admitting: Physician Assistant

## 2014-07-24 VITALS — BP 124/72 | HR 62 | Temp 98.6°F | Resp 18 | Ht 64.0 in | Wt 143.0 lb

## 2014-07-24 DIAGNOSIS — R351 Nocturia: Secondary | ICD-10-CM | POA: Diagnosis not present

## 2014-07-24 DIAGNOSIS — M6588 Other synovitis and tenosynovitis, other site: Secondary | ICD-10-CM

## 2014-07-24 DIAGNOSIS — R300 Dysuria: Secondary | ICD-10-CM | POA: Diagnosis not present

## 2014-07-24 DIAGNOSIS — M545 Low back pain: Secondary | ICD-10-CM | POA: Diagnosis not present

## 2014-07-24 DIAGNOSIS — M659 Synovitis and tenosynovitis, unspecified: Secondary | ICD-10-CM

## 2014-07-24 LAB — POCT UA - MICROSCOPIC ONLY
Bacteria, U Microscopic: NEGATIVE
Casts, Ur, LPF, POC: NEGATIVE
Crystals, Ur, HPF, POC: NEGATIVE
MUCUS UA: NEGATIVE
RBC, urine, microscopic: NEGATIVE
WBC, UR, HPF, POC: NEGATIVE
YEAST UA: NEGATIVE

## 2014-07-24 LAB — POCT WET PREP WITH KOH
KOH Prep POC: NEGATIVE
RBC Wet Prep HPF POC: NEGATIVE
Trichomonas, UA: NEGATIVE
Yeast Wet Prep HPF POC: NEGATIVE

## 2014-07-24 LAB — POCT URINALYSIS DIPSTICK
BILIRUBIN UA: NEGATIVE
Blood, UA: NEGATIVE
GLUCOSE UA: NEGATIVE
KETONES UA: NEGATIVE
NITRITE UA: NEGATIVE
PH UA: 7
Protein, UA: NEGATIVE
Spec Grav, UA: 1.025
Urobilinogen, UA: 0.2

## 2014-07-24 LAB — POCT CBC
GRANULOCYTE PERCENT: 49.3 % (ref 37–80)
HEMATOCRIT: 34.8 % — AB (ref 37.7–47.9)
HEMOGLOBIN: 10.9 g/dL — AB (ref 12.2–16.2)
LYMPH, POC: 2.2 (ref 0.6–3.4)
MCH, POC: 22.4 pg — AB (ref 27–31.2)
MCHC: 31.3 g/dL — AB (ref 31.8–35.4)
MCV: 71.7 fL — AB (ref 80–97)
MID (cbc): 0.5 (ref 0–0.9)
MPV: 7.8 fL (ref 0–99.8)
POC GRANULOCYTE: 2.6 (ref 2–6.9)
POC LYMPH PERCENT: 42.1 %L (ref 10–50)
POC MID %: 8.6 %M (ref 0–12)
Platelet Count, POC: 353 10*3/uL (ref 142–424)
RBC: 4.85 M/uL (ref 4.04–5.48)
RDW, POC: 15.7 %
WBC: 5.3 10*3/uL (ref 4.6–10.2)

## 2014-07-24 LAB — POCT GLYCOSYLATED HEMOGLOBIN (HGB A1C): Hemoglobin A1C: 5.1

## 2014-07-24 LAB — POCT URINE PREGNANCY: PREG TEST UR: NEGATIVE

## 2014-07-24 MED ORDER — ACETAMINOPHEN 500 MG PO TABS: 500.0000 mg | ORAL_TABLET | Freq: Four times a day (QID) | ORAL | Status: DC | PRN

## 2014-07-24 MED ORDER — MELOXICAM 15 MG PO TABS: 7.5000 mg | ORAL_TABLET | Freq: Every day | ORAL | Status: DC

## 2014-07-24 NOTE — Progress Notes (Signed)
07/24/2014 at 2:09 PM  Renee Thornton / DOB: 16-Oct-1990 / MRN: [id number]  The patient  does not have a problem list on file.  HPI  Renee Thornton is a 24 year old right handed female complaining of nocturia and mild dysuria that started roughly 3-4 months ago and has been waxing a waning. She denies frequency, urgency, suprapubic pressure and hematuria. There are no aggravating and alleviating factors. She has tried nothing for her symptoms. She denies vaginal discharge or irritation, and has had one partner from the months of November of 2015 to January of 2016.  She reports using condoms during that time. She denies constipation and reports her bowel are often loose 2/2 eating blooming onions.   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 She reports tenderness just distal to the right radial head, and feels like her arm is slighlty swollen.  She denies trauma and elbow pain and hand pain.           She  has a past medical history of Allergy.    She has a current medication list which includes the following prescription(s): acetaminophen and meloxicam.  Renee Thornton has No Known Allergies. She  reports that she has never smoked. She has never used smokeless tobacco. She reports that she does not drink alcohol or use illicit drugs. She  has no sexual activity history on file. The patient  has no past surgical history on file.  Her family history is not on file.  Review of Systems  Eyes: Negative.   Cardiovascular: Negative.   Musculoskeletal: Negative.   Skin: Negative for rash.  Neurological: Negative for dizziness and headaches.    OBJECTIVE  Her  height is 5\' 4"  (1.626 m) and weight is 143 lb (64.864  kg). Her oral temperature is 98.6 F (37 C). Her blood pressure is 124/72 and her pulse is 62. Her respiration is 18 and oxygen saturation is 100%.  The patient's body mass index is 24.53 kg/(m^2).  Physical Exam  Constitutional: She is oriented to person, place, and time. She appears well-developed and well-nourished. No distress.  Respiratory: Effort normal and breath sounds normal.  GI: Soft. Bowel sounds are normal. She exhibits no distension and no mass. There is no tenderness. There is no rebound and no guarding.  Musculoskeletal:       Right wrist: She exhibits normal range of motion, no bony tenderness and no deformity.       Right hand: Normal.       Hands: Neurological: She is alert and oriented to person, place, and time. She has normal strength. No cranial nerve deficit or sensory deficit.  Skin: Skin is warm and dry. She is not diaphoretic.  Psychiatric: She has a normal mood and affect.    Results for orders placed or performed in visit on 07/24/14 (from the past 24 hour(s))  POCT urinalysis dipstick     Status: None   Collection Time: 07/24/14 12:52 PM  Result Value Ref Range   Color, UA yellow    Clarity, UA clear    Glucose, UA neg    Bilirubin, UA neg    Ketones, UA neg  Spec Grav, UA 1.025    Blood, UA neg    pH, UA 7.0    Protein, UA neg    Urobilinogen, UA 0.2    Nitrite, UA neg    Leukocytes, UA small (1+)   POCT UA - Microscopic Only     Status: None   Collection Time: 07/24/14 12:53 PM  Result Value Ref Range   WBC, Ur, HPF, POC neg    RBC, urine, microscopic neg    Bacteria, U Microscopic neg    Mucus, UA neg    Epithelial cells, urine per micros 0-1    Crystals, Ur, HPF, POC neg    Casts, Ur, LPF, POC neg    Yeast, UA neg   POCT Wet Prep with KOH     Status: None   Collection Time: 07/24/14  1:55 PM  Result Value Ref Range   Trichomonas, UA Negative    Clue Cells Wet Prep HPF POC 0-1    Epithelial Wet Prep HPF POC 1-6    Yeast Wet Prep HPF  POC neg    Bacteria Wet Prep HPF POC trace    RBC Wet Prep HPF POC neg    WBC Wet Prep HPF POC 1-4    KOH Prep POC Negative   POCT CBC     Status: Abnormal   Collection Time: 07/24/14  1:56 PM  Result Value Ref Range   WBC 5.3 4.6 - 10.2 K/uL   Lymph, poc 2.2 0.6 - 3.4   POC LYMPH PERCENT 42.1 10 - 50 %L   MID (cbc) 0.5 0 - 0.9   POC MID % 8.6 0 - 12 %M   POC Granulocyte 2.6 2 - 6.9   Granulocyte percent 49.3 37 - 80 %G   RBC 4.85 4.04 - 5.48 M/uL   Hemoglobin 10.9 (A) 12.2 - 16.2 g/dL   HCT, POC 16.1 (A) 09.6 - 47.9 %   MCV 71.7 (A) 80 - 97 fL   MCH, POC 22.4 (A) 27 - 31.2 pg   MCHC 31.3 (A) 31.8 - 35.4 g/dL   RDW, POC 04.5 %   Platelet Count, POC 353 142 - 424 K/uL   MPV 7.8 0 - 99.8 fL  POCT urine pregnancy     Status: None   Collection Time: 07/24/14  1:56 PM  Result Value Ref Range   Preg Test, Ur Negative   POCT glycosylated hemoglobin (Hb A1C)     Status: None   Collection Time: 07/24/14  2:06 PM  Result Value Ref Range   Hemoglobin A1C 5.1     ASSESSMENT & PLAN  Renee Thornton was seen today for back pain and urinary frequency.  Diagnoses and all orders for this visit:  Dysuria: Work up negative thus far.  Will await culture and then speak with patient regarding a plan.  Orders: -     Urine culture -     POCT Wet Prep with KOH -     HIV antibody -     RPR -     GC/Chlamydia Probe Amp -     POCT CBC  Nocturia Orders: -     POCT urinalysis dipstick -     POCT UA - Microscopic Only -     POCT glycosylated hemoglobin (Hb A1C) -     POCT urine pregnancy  Low back pain without sciatica, unspecified back pain laterality Orders: -     POCT urinalysis dipstick -     POCT UA - Microscopic  Only -     POCT CBC  Tenosynovitis of wrist: Patient has similar symptoms in the past.  Positive Finkelstein's and there is tenderness of the over the lateral aspect of the wrist.  Patient provided with NSAIDs and sent to ortho.  Advised that she carry trays with her left hand  until being seen and advised by   Orders: -     meloxicam (MOBIC) 15 MG tablet; Take 0.5-1 tablets (7.5-15 mg total) by mouth daily. -     acetaminophen (TYLENOL) 500 MG tablet; Take 1 tablet (500 mg total) by mouth every 6 (six) hours as needed. -     Ambulatory referral to Orthopedic Surgery    The patient was advised to call or come back to clinic if she does not see an improvement in symptoms, or worsens with the above plan.   Deliah Boston, MHS, PA-C [facility] 07/24/2014 2:09 PM

## 2014-07-24 NOTE — Patient Instructions (Signed)
Purchase some OTC pyridium and take as instructed per the box.

## 2014-07-25 LAB — RPR

## 2014-07-25 LAB — GC/CHLAMYDIA PROBE AMP
CT Probe RNA: NEGATIVE
GC Probe RNA: NEGATIVE

## 2014-07-25 LAB — URINE CULTURE

## 2014-07-25 LAB — HIV ANTIBODY (ROUTINE TESTING W REFLEX): HIV 1&2 Ab, 4th Generation: NONREACTIVE

## 2014-08-12 ENCOUNTER — Ambulatory Visit (INDEPENDENT_AMBULATORY_CARE_PROVIDER_SITE_OTHER): Payer: BC Managed Care – PPO | Admitting: Physician Assistant

## 2014-08-12 VITALS — BP 102/68 | HR 62 | Temp 98.4°F | Resp 18 | Ht 63.5 in | Wt 144.0 lb

## 2014-08-12 DIAGNOSIS — Z111 Encounter for screening for respiratory tuberculosis: Secondary | ICD-10-CM | POA: Diagnosis not present

## 2014-08-12 DIAGNOSIS — Z0000 Encounter for general adult medical examination without abnormal findings: Secondary | ICD-10-CM

## 2014-08-12 NOTE — Progress Notes (Signed)

## 2014-08-12 NOTE — Patient Instructions (Signed)
Your exam was normal today.  We placed a ppd today to check for TB.  Please come back to check it in 48-72 hours.  Please check with your physician to determine your vaccination status.   Health Maintenance Adopting a healthy lifestyle and getting preventive care can go a long way to promote health and wellness. Talk with your health care provider about what schedule of regular examinations is right for you. This is a good chance for you to check in with your provider about disease prevention and staying healthy. In between checkups, there are plenty of things you can do on your own. Experts have done a lot of research about which lifestyle changes and preventive measures are most likely to keep you healthy. Ask your health care provider for more information. WEIGHT AND DIET  Eat a healthy diet  Be sure to include plenty of vegetables, fruits, low-fat dairy products, and lean protein.  Do not eat a lot of foods high in solid fats, added sugars, or salt.  Get regular exercise. This is one of the most important things you can do for your health.  Most adults should exercise for at least 150 minutes each week. The exercise should increase your heart rate and make you sweat (moderate-intensity exercise).  Most adults should also do strengthening exercises at least twice a week. This is in addition to the moderate-intensity exercise.  Maintain a healthy weight  Body mass index (BMI) is a measurement that can be used to identify possible weight problems. It estimates body fat based on height and weight. Your health care provider can help determine your BMI and help you achieve or maintain a healthy weight.  For females 59 years of age and older:   A BMI below 18.5 is considered underweight.  A BMI of 18.5 to 24.9 is normal.  A BMI of 25 to 29.9 is considered overweight.  A BMI of 30 and above is considered obese.  Watch levels of cholesterol and blood lipids  You should start having  your blood tested for lipids and cholesterol at 23 years of age, then have this test every 5 years.  You may need to have your cholesterol levels checked more often if:  Your lipid or cholesterol levels are high.  You are older than 23 years of age.  You are at high risk for heart disease.  CANCER SCREENING   Lung Cancer  Lung cancer screening is recommended for adults 45-82 years old who are at high risk for lung cancer because of a history of smoking.  A yearly low-dose CT scan of the lungs is recommended for people who:  Currently smoke.  Have quit within the past 15 years.  Have at least a 30-pack-year history of smoking. A pack year is smoking an average of one pack of cigarettes a day for 1 year.  Yearly screening should continue until it has been 15 years since you quit.  Yearly screening should stop if you develop a health problem that would prevent you from having lung cancer treatment.  Breast Cancer  Practice breast self-awareness. This means understanding how your breasts normally appear and feel.  It also means doing regular breast self-exams. Let your health care provider know about any changes, no matter how small.  If you are in your 20s or 30s, you should have a clinical breast exam (CBE) by a health care provider every 1-3 years as part of a regular health exam.  If you are 40 or  older, have a CBE every year. Also consider having a breast X-ray (mammogram) every year.  If you have a family history of breast cancer, talk to your health care provider about genetic screening.  If you are at high risk for breast cancer, talk to your health care provider about having an MRI and a mammogram every year.  Breast cancer gene (BRCA) assessment is recommended for women who have family members with BRCA-related cancers. BRCA-related cancers include:  Breast.  Ovarian.  Tubal.  Peritoneal cancers.  Results of the assessment will determine the need for genetic  counseling and BRCA1 and BRCA2 testing. Cervical Cancer Routine pelvic examinations to screen for cervical cancer are no longer recommended for nonpregnant women who are considered low risk for cancer of the pelvic organs (ovaries, uterus, and vagina) and who do not have symptoms. A pelvic examination may be necessary if you have symptoms including those associated with pelvic infections. Ask your health care provider if a screening pelvic exam is right for you.   The Pap test is the screening test for cervical cancer for women who are considered at risk.  If you had a hysterectomy for a problem that was not cancer or a condition that could lead to cancer, then you no longer need Pap tests.  If you are older than 65 years, and you have had normal Pap tests for the past 10 years, you no longer need to have Pap tests.  If you have had past treatment for cervical cancer or a condition that could lead to cancer, you need Pap tests and screening for cancer for at least 20 years after your treatment.  If you no longer get a Pap test, assess your risk factors if they change (such as having a new sexual partner). This can affect whether you should start being screened again.  Some women have medical problems that increase their chance of getting cervical cancer. If this is the case for you, your health care provider may recommend more frequent screening and Pap tests.  The human papillomavirus (HPV) test is another test that may be used for cervical cancer screening. The HPV test looks for the virus that can cause cell changes in the cervix. The cells collected during the Pap test can be tested for HPV.  The HPV test can be used to screen women 55 years of age and older. Getting tested for HPV can extend the interval between normal Pap tests from three to five years.  An HPV test also should be used to screen women of any age who have unclear Pap test results.  After 23 years of age, women should have  HPV testing as often as Pap tests.  Colorectal Cancer  This type of cancer can be detected and often prevented.  Routine colorectal cancer screening usually begins at 23 years of age and continues through 23 years of age.  Your health care provider may recommend screening at an earlier age if you have risk factors for colon cancer.  Your health care provider may also recommend using home test kits to check for hidden blood in the stool.  A small camera at the end of a tube can be used to examine your colon directly (sigmoidoscopy or colonoscopy). This is done to check for the earliest forms of colorectal cancer.  Routine screening usually begins at age 31.  Direct examination of the colon should be repeated every 5-10 years through 23 years of age. However, you may need to be  screened more often if early forms of precancerous polyps or small growths are found. Skin Cancer  Check your skin from head to toe regularly.  Tell your health care provider about any new moles or changes in moles, especially if there is a change in a mole's shape or color.  Also tell your health care provider if you have a mole that is larger than the size of a pencil eraser.  Always use sunscreen. Apply sunscreen liberally and repeatedly throughout the day.  Protect yourself by wearing long sleeves, pants, a wide-brimmed hat, and sunglasses whenever you are outside. HEART DISEASE, DIABETES, AND HIGH BLOOD PRESSURE   Have your blood pressure checked at least every 1-2 years. High blood pressure causes heart disease and increases the risk of stroke.  If you are between 62 years and 49 years old, ask your health care provider if you should take aspirin to prevent strokes.  Have regular diabetes screenings. This involves taking a blood sample to check your fasting blood sugar level.  If you are at a normal weight and have a low risk for diabetes, have this test once every three years after 23 years of  age.  If you are overweight and have a high risk for diabetes, consider being tested at a younger age or more often. PREVENTING INFECTION  Hepatitis B  If you have a higher risk for hepatitis B, you should be screened for this virus. You are considered at high risk for hepatitis B if:  You were born in a country where hepatitis B is common. Ask your health care provider which countries are considered high risk.  Your parents were born in a high-risk country, and you have not been immunized against hepatitis B (hepatitis B vaccine).  You have HIV or AIDS.  You use needles to inject street drugs.  You live with someone who has hepatitis B.  You have had sex with someone who has hepatitis B.  You get hemodialysis treatment.  You take certain medicines for conditions, including cancer, organ transplantation, and autoimmune conditions. Hepatitis C  Blood testing is recommended for:  Everyone born from [birth year] through 1964.  Anyone with known risk factors for hepatitis C. Sexually transmitted infections (STIs)  You should be screened for sexually transmitted infections (STIs) including gonorrhea and chlamydia if:  You are sexually active and are younger than 23 years of age.  You are older than 24 years of age and your health care provider tells you that you are at risk for this type of infection.  Your sexual activity has changed since you were last screened and you are at an increased risk for chlamydia or gonorrhea. Ask your health care provider if you are at risk.  If you do not have HIV, but are at risk, it may be recommended that you take a prescription medicine daily to prevent HIV infection. This is called pre-exposure prophylaxis (PrEP). You are considered at risk if:  You are sexually active and do not regularly use condoms or know the HIV status of your partner(s).  You take drugs by injection.  You are sexually active with a partner who has HIV. Talk with your health  care provider about whether you are at high risk of being infected with HIV. If you choose to begin PrEP, you should first be tested for HIV. You should then be tested every 3 months for as long as you are taking PrEP.  PREGNANCY   If you are premenopausal and you  may become pregnant, ask your health care provider about preconception counseling.  If you may become pregnant, take 400 to 800 micrograms (mcg) of folic acid every day.  If you want to prevent pregnancy, talk to your health care provider about birth control (contraception). OSTEOPOROSIS AND MENOPAUSE   Osteoporosis is a disease in which the bones lose minerals and strength with aging. This can result in serious bone fractures. Your risk for osteoporosis can be identified using a bone density scan.  If you are 35 years of age or older, or if you are at risk for osteoporosis and fractures, ask your health care provider if you should be screened.  Ask your health care provider whether you should take a calcium or vitamin D supplement to lower your risk for osteoporosis.  Menopause may have certain physical symptoms and risks.  Hormone replacement therapy may reduce some of these symptoms and risks. Talk to your health care provider about whether hormone replacement therapy is right for you.  HOME CARE INSTRUCTIONS   Schedule regular health, dental, and eye exams.  Stay current with your immunizations.   Do not use any tobacco products including cigarettes, chewing tobacco, or electronic cigarettes.  If you are pregnant, do not drink alcohol.  If you are breastfeeding, limit how much and how often you drink alcohol.  Limit alcohol intake to no more than 1 drink per day for nonpregnant women. One drink equals 12 ounces of beer, 5 ounces of wine, or 1 ounces of hard liquor.  Do not use street drugs.  Do not share needles.  Ask your health care provider for help if you need support or information about quitting  drugs.  Tell your health care provider if you often feel depressed.  Tell your health care provider if you have ever been abused or do not feel safe at home. Document Released: 10/19/2010 Document Revised: 08/20/2013 Document Reviewed: 03/07/2013 [facility] Patient Information 2015 [place], [place]. This information is not intended to replace advice given to you by your health care provider. Make sure you discuss any questions you have with your health care provider.

## 2014-08-12 NOTE — Progress Notes (Signed)
   Subjective:    Patient ID: Renee Thornton, female    DOB: 01/13/1991, 24 y.o.   MRN: [id number]  Chief Complaint  Patient presents with  . Annual Exam    has form   . Immunizations    ppd    There are no active problems to display for this patient.  Prior to Admission medications   Not on File   Medications, allergies, past medical history, surgical history, family history, social history and problem list reviewed and updated.  HPI  [age] yof with no significant pmh presents needing cpe for work.  Graduated from [employer] with BS in speech language pathology. [place] She plans to go back to school for a masters in a few years.   She does not exercise. She is currently a waitress so eats out a lot and eats at odd hours of the day. Drinks soda daily. She is planning to change her diet with her new position. Not currently sexually active. Sees a dentist yearly.   Denies any issues or complaints today. Last pap 2014, normal. Denies smoking, alcohol use, or illicit drug use.   She is unsure about her immunization hx. From [place] and has a family physician there who she has received most of her immunizations from.   Needs tb screen today. Denies fevers, chills, unintentional wt loss, night sweats.   Review of Systems All negative per complete physical exam form.     Objective:   Physical Exam  Constitutional: She is oriented to person, place, and time. She appears well-developed and well-nourished.  Non-toxic appearance. She does not have a sickly appearance. She does not appear ill. No distress.  BP 102/68 mmHg  Pulse 62  Temp(Src) 98.4 F (36.9 C) (Oral)  Resp 18  Ht 5' 3.5" (1.613 m)  Wt 144 lb (65.318 kg)  BMI 25.11 kg/m2  SpO2 98%  LMP 08/05/2014   Eyes: Conjunctivae and EOM are normal. Pupils are equal, round, and reactive to light.  Neck: Trachea normal and normal range of motion. No thyroid mass and no  thyromegaly present.  Cardiovascular: Normal rate, regular rhythm and normal heart sounds.   Pulmonary/Chest: Effort normal and breath sounds normal.  Neurological: She is alert and oriented to person, place, and time. She has normal strength. No cranial nerve deficit or sensory deficit. She displays a negative Romberg sign.  Psychiatric: She has a normal mood and affect. Her speech is normal and behavior is normal.      Assessment & Plan:   [age] yof with no significant pmh presents needing cpe for work.  Annual physical exam PPD screening test - Plan: TB Skin Test --normal exam, vitals today --ppd placed, to rtc 48-72 hrs for read --pt encouraged to obtain immunization records from family physician, instructed we can draw titers or give immunizations as needed here, she wishes to obtain records first  Donnajean Lopesodd M. Lana Flaim, PA-C Physician Assistant-Certified [facility]  08/12/2014 6:36 PM

## 2014-08-13 NOTE — Progress Notes (Signed)
  Medical screening examination/treatment/procedure(s) were performed by non-physician practitioner and as supervising physician I was immediately available for consultation/collaboration.     

## 2014-08-14 ENCOUNTER — Ambulatory Visit (INDEPENDENT_AMBULATORY_CARE_PROVIDER_SITE_OTHER): Payer: BC Managed Care – PPO

## 2014-08-14 DIAGNOSIS — Z111 Encounter for screening for respiratory tuberculosis: Secondary | ICD-10-CM

## 2014-08-14 LAB — TB SKIN TEST
Induration: 0 mm
TB SKIN TEST: NEGATIVE

## 2014-10-23 ENCOUNTER — Telehealth: Payer: Self-pay

## 2014-10-23 NOTE — Telephone Encounter (Signed)
Patient left voiecmail today at 12:16pm requesting a call back regarding copies of PE and TB that she had done. Cb# [phone].

## 2014-10-26 NOTE — Telephone Encounter (Signed)
Patient came into clinic on Saturday 10/26/14 to see if she could get records, I had her fill out an ROI and printed the records out for her. She wanted OV notes from 08/12/14.
